# Patient Record
Sex: Male | Born: 1945 | Race: White | Hispanic: No | Smoking: Never smoker
Health system: Southern US, Community
[De-identification: ages and names within clinical notes are randomized; demographics above are authoritative.]

## PROBLEM LIST (undated history)

## (undated) DIAGNOSIS — N289 Disorder of kidney and ureter, unspecified: Secondary | ICD-10-CM

## (undated) DIAGNOSIS — C801 Malignant (primary) neoplasm, unspecified: Secondary | ICD-10-CM

## (undated) DIAGNOSIS — I10 Essential (primary) hypertension: Secondary | ICD-10-CM

## (undated) DIAGNOSIS — N35919 Unspecified urethral stricture, male, unspecified site: Secondary | ICD-10-CM

## (undated) DIAGNOSIS — J189 Pneumonia, unspecified organism: Secondary | ICD-10-CM

## (undated) DIAGNOSIS — Z87442 Personal history of urinary calculi: Secondary | ICD-10-CM

## (undated) HISTORY — PX: TONSILLECTOMY: SUR1361

## (undated) HISTORY — PX: COLON SURGERY: SHX602

## (undated) HISTORY — PX: CHOLECYSTECTOMY: SHX55

## (undated) HISTORY — PX: ANAL FISSURE REPAIR: SHX2312

---
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
## 2009-01-15 ENCOUNTER — Encounter
Admission: RE | Admit: 2009-01-15 | Discharge: 2009-01-15 | Payer: Self-pay | Source: Home / Self Care | Admitting: Neurology

## 2015-07-15 ENCOUNTER — Telehealth: Payer: Self-pay | Admitting: Internal Medicine

## 2015-07-15 NOTE — Telephone Encounter (Signed)
ERROR

## 2017-03-27 DIAGNOSIS — J189 Pneumonia, unspecified organism: Secondary | ICD-10-CM

## 2017-03-27 HISTORY — DX: Pneumonia, unspecified organism: J18.9

## 2018-09-19 ENCOUNTER — Encounter (HOSPITAL_COMMUNITY): Payer: Self-pay | Admitting: Emergency Medicine

## 2018-09-19 ENCOUNTER — Emergency Department (HOSPITAL_COMMUNITY)
Admission: EM | Admit: 2018-09-19 | Discharge: 2018-09-19 | Disposition: A | Discharge status: Home / Self Care | Payer: Medicare Other | Attending: Emergency Medicine | Admitting: Emergency Medicine

## 2018-09-19 ENCOUNTER — Other Ambulatory Visit: Payer: Self-pay

## 2018-09-19 ENCOUNTER — Emergency Department (HOSPITAL_COMMUNITY): Payer: Medicare Other

## 2018-09-19 DIAGNOSIS — K7689 Other specified diseases of liver: Secondary | ICD-10-CM | POA: Diagnosis not present

## 2018-09-19 DIAGNOSIS — R109 Unspecified abdominal pain: Secondary | ICD-10-CM

## 2018-09-19 DIAGNOSIS — N201 Calculus of ureter: Secondary | ICD-10-CM | POA: Insufficient documentation

## 2018-09-19 DIAGNOSIS — N390 Urinary tract infection, site not specified: Secondary | ICD-10-CM

## 2018-09-19 DIAGNOSIS — I10 Essential (primary) hypertension: Secondary | ICD-10-CM | POA: Insufficient documentation

## 2018-09-19 DIAGNOSIS — D075 Carcinoma in situ of prostate: Secondary | ICD-10-CM | POA: Insufficient documentation

## 2018-09-19 DIAGNOSIS — N35919 Unspecified urethral stricture, male, unspecified site: Secondary | ICD-10-CM | POA: Insufficient documentation

## 2018-09-19 DIAGNOSIS — N200 Calculus of kidney: Secondary | ICD-10-CM | POA: Diagnosis not present

## 2018-09-19 DIAGNOSIS — N179 Acute kidney failure, unspecified: Secondary | ICD-10-CM

## 2018-09-19 HISTORY — DX: Unspecified urethral stricture, male, unspecified site: N35.919

## 2018-09-19 HISTORY — DX: Malignant (primary) neoplasm, unspecified: C80.1

## 2018-09-19 HISTORY — DX: Essential (primary) hypertension: I10

## 2018-09-19 HISTORY — DX: Disorder of kidney and ureter, unspecified: N28.9

## 2018-09-19 LAB — URINALYSIS, ROUTINE W REFLEX MICROSCOPIC
Bilirubin Urine: NEGATIVE
Glucose, UA: NEGATIVE mg/dL
Ketones, ur: NEGATIVE mg/dL
Nitrite: POSITIVE — AB
Protein, ur: 30 mg/dL — AB
RBC / HPF: >50 RBC/hpf — ABNORMAL HIGH (ref 0–5)
Specific Gravity, Urine: 1.019 (ref 1.005–1.030)
WBC, UA: >50 WBC/hpf — ABNORMAL HIGH (ref 0–5)
pH: 6 (ref 5.0–8.0)

## 2018-09-19 LAB — CBC WITH DIFFERENTIAL/PLATELET
Abs Immature Granulocytes: 0.03 10*3/uL (ref 0.00–0.07)
Basophils Absolute: 0.1 10*3/uL (ref 0.0–0.1)
Basophils Relative: 1 %
Eosinophils Absolute: 0.1 10*3/uL (ref 0.0–0.5)
Eosinophils Relative: 1 %
HCT: 43.5 % (ref 39.0–52.0)
Hemoglobin: 14 g/dL (ref 13.0–17.0)
Immature Granulocytes: 0 %
Lymphocytes Relative: 10 %
Lymphs Abs: 0.9 10*3/uL (ref 0.7–4.0)
MCH: 30.7 pg (ref 26.0–34.0)
MCHC: 32.2 g/dL (ref 30.0–36.0)
MCV: 95.4 fL (ref 80.0–100.0)
Monocytes Absolute: 0.9 10*3/uL (ref 0.1–1.0)
Monocytes Relative: 9 %
Neutro Abs: 7.8 10*3/uL — ABNORMAL HIGH (ref 1.7–7.7)
Neutrophils Relative %: 79 %
Platelets: 214 10*3/uL (ref 150–400)
RBC: 4.56 MIL/uL (ref 4.22–5.81)
RDW: 14 % (ref 11.5–15.5)
WBC: 9.8 10*3/uL (ref 4.0–10.5)
nRBC: 0 % (ref 0.0–0.2)

## 2018-09-19 LAB — BASIC METABOLIC PANEL
Anion gap: 9 (ref 5–15)
BUN: 22 mg/dL (ref 8–23)
CO2: 24 mmol/L (ref 22–32)
Calcium: 8.8 mg/dL — ABNORMAL LOW (ref 8.9–10.3)
Chloride: 106 mmol/L (ref 98–111)
Creatinine, Ser: 1.29 mg/dL — ABNORMAL HIGH (ref 0.61–1.24)
GFR calc Af Amer: >60 mL/min (ref >60)
GFR calc non Af Amer: 54 mL/min — ABNORMAL LOW (ref >60)
Glucose, Bld: 90 mg/dL (ref 70–99)
Potassium: 4 mmol/L (ref 3.5–5.1)
Sodium: 139 mmol/L (ref 135–145)

## 2018-09-19 MED ORDER — KETOROLAC TROMETHAMINE 30 MG/ML IJ SOLN
30.0000 mg | Freq: Once | INTRAMUSCULAR | Status: AC
  Administered 2018-09-19: 30 mg via INTRAVENOUS
  Filled 2018-09-19: qty 1

## 2018-09-19 MED ORDER — ONDANSETRON HCL 8 MG PO TABS: 8.0000 mg | ORAL_TABLET | ORAL | 0 refills | Status: DC | PRN

## 2018-09-19 MED ORDER — FENTANYL CITRATE (PF) 100 MCG/2ML IJ SOLN
50.0000 ug | Freq: Once | INTRAMUSCULAR | Status: AC
  Administered 2018-09-19: 50 ug via INTRAVENOUS
  Filled 2018-09-19: qty 2

## 2018-09-19 MED ORDER — CEFTRIAXONE SODIUM 1 G IJ SOLR
1.0000 g | Freq: Once | INTRAMUSCULAR | Status: AC
  Administered 2018-09-19: 1 g via INTRAVENOUS
  Filled 2018-09-19: qty 10

## 2018-09-19 MED ORDER — OXYCODONE-ACETAMINOPHEN 5-325 MG PO TABS: 1.0000 | ORAL_TABLET | ORAL | 0 refills | Status: DC | PRN

## 2018-09-19 MED ORDER — SULFAMETHOXAZOLE-TRIMETHOPRIM 800-160 MG PO TABS: 1.0000 | ORAL_TABLET | Freq: Two times a day (BID) | ORAL | 0 refills | Status: AC

## 2018-09-19 MED ORDER — SODIUM CHLORIDE 0.9 % IV BOLUS
500.0000 mL | Freq: Once | INTRAVENOUS | Status: AC
  Administered 2018-09-19: 500 mL via INTRAVENOUS

## 2018-09-19 MED ORDER — ONDANSETRON HCL 4 MG/2ML IJ SOLN
4.0000 mg | Freq: Once | INTRAMUSCULAR | Status: AC
  Administered 2018-09-19: 19:00:00 4 mg via INTRAVENOUS
  Filled 2018-09-19: qty 2

## 2018-09-19 NOTE — ED Provider Notes (Addendum)
[facility] EMERGENCY DEPARTMENT Provider Note   CSN: [id number] Arrival date & time: 09/19/18  1708    History   Chief Complaint Chief Complaint  Patient presents with   Flank Pain    HPI Donald Gibson is a 73 y.o. male.     Right flank pain for several hours.  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  Patient self catheterizes daily.  No fever, chills, hematuria, dysuria.  Severity is moderate.  Nothing makes symptoms better or worse.  Recent PSA 11.     Past Medical History:  Diagnosis Date   Cancer [facility])    prostate   Hypertension    Renal disorder    Urethral stricture in male     There are no active problems to display for this patient.   Past Surgical History:  Procedure Laterality Date   ANAL FISSURE REPAIR     CHOLECYSTECTOMY     COLON SURGERY     TONSILLECTOMY          Home Medications    Prior to Admission medications   Not on File    Family History Family History  Problem Relation Age of Onset   Alzheimer's disease Mother    Hypertension Father    Kidney failure Father     Social History Social History   Tobacco Use   Smoking status: Never Smoker   Smokeless tobacco: Never Used  Substance Use Topics   Alcohol use: Never    Frequency: Never   Drug use: Never     Allergies   Codeine and Penicillins   Review of Systems Review of Systems  All other systems reviewed and are negative.    Physical Exam Updated Vital Signs BP 135/74 (BP Location: Right Arm)    Pulse 65    Temp 98.8 F (37.1 C) (Oral)    Resp 17    SpO2 97%   Physical Exam Vitals signs and nursing note reviewed.  Constitutional:      Appearance: He is well-developed.     Comments: nad  HENT:     Head: Normocephalic and atraumatic.  Eyes:     Conjunctiva/sclera: Conjunctivae normal.  Neck:     Musculoskeletal: Neck supple.  Cardiovascular:     Rate and  Rhythm: Normal rate and regular rhythm.  Pulmonary:     Effort: Pulmonary effort is normal.     Breath sounds: Normal breath sounds.  Abdominal:     General: Bowel sounds are normal.     Palpations: Abdomen is soft.  Genitourinary:    Comments: Tender right flank Musculoskeletal: Normal range of motion.  Skin:    General: Skin is warm and dry.  Neurological:     Mental Status: He is alert and oriented to person, place, and time.  Psychiatric:        Behavior: Behavior normal.      ED Treatments / Results  Labs (all labs ordered are listed, but only abnormal results are displayed) Labs Reviewed  URINALYSIS, ROUTINE W REFLEX MICROSCOPIC - Abnormal; Notable for the following components:      Result Value   Color, Urine AMBER (*)    APPearance HAZY (*)    Hgb urine dipstick LARGE (*)    Protein, ur 30 (*)    Nitrite POSITIVE (*)    Leukocytes,Ua LARGE (*)    RBC / HPF >50 (*)    WBC, UA >50 (*)  Bacteria, UA MANY (*)    All other components within normal limits  CBC WITH DIFFERENTIAL/PLATELET - Abnormal; Notable for the following components:   Neutro Abs 7.8 (*)    All other components within normal limits  BASIC METABOLIC PANEL - Abnormal; Notable for the following components:   Creatinine, Ser 1.29 (*)    Calcium 8.8 (*)    GFR calc non Af Amer 54 (*)    All other components within normal limits  URINE CULTURE    EKG None  Radiology Ct Renal Stone Study  Result Date: 09/19/2018 CLINICAL DATA:  Right-sided flank pain, history of prostate and colon cancer EXAM: CT ABDOMEN AND PELVIS WITHOUT CONTRAST TECHNIQUE: Multidetector CT imaging of the abdomen and pelvis was performed following the standard protocol without IV contrast. COMPARISON:  CT 06/30/2011 FINDINGS: Lower chest: Lung bases demonstrate mild dependent atelectasis. No consolidation or effusion. Borderline cardiomegaly. Hepatobiliary: Development of vague hypodense liver masses, measuring up to 19 mm in  size. Status post cholecystectomy. No biliary dilatation Pancreas: Unremarkable. No pancreatic ductal dilatation or surrounding inflammatory changes. Spleen: Normal in size without focal abnormality. Adrenals/Urinary Tract: Adrenal glands are normal. Perinephric fat stranding. 7 mm stone lower pole right kidney. Mild right hydronephrosis, secondary to a 7 mm stone at the right UPJ. Thick-walled appearance of the urinary bladder with surrounding stranding. Stomach/Bowel: Stomach nonenlarged. No dilated small bowel. Large stool burden. Postsurgical changes of the sigmoid colon. Vascular/Lymphatic: Moderate aortic atherosclerosis. No aneurysmal dilatation. No significantly enlarged lymph nodes. Reproductive: Slightly enlarged prostate abutting or invading posterior wall of the bladder. Other: Negative for free air or free fluid. Fat within the bilateral inguinal canals. Musculoskeletal: No acute or suspicious osseous abnormality. Degenerative changes of the spine. IMPRESSION: 1. Mild right hydronephrosis, secondary to a 7 mm stone at the right UPJ. 2. Additional intrarenal stone on the right 3. Interim development of vague hypodense liver masses concerning for metastatic disease given clinical history. Further evaluation with nonemergent MRI could be obtained. 4. Slightly enlarged prostate which either abuts or invades the posterior wall of the bladder. Bladder appears thick walled with surrounding hazy soft tissue stranding suggesting cystitis. Electronically Signed   By: Donavan Foil M.D.   On: 09/19/2018 21:51    Procedures Procedures (including critical care time)  Medications Ordered in ED Medications  sodium chloride 0.9 % bolus 500 mL (0 mLs Intravenous Stopped 09/19/18 2011)  fentaNYL (SUBLIMAZE) injection 50 mcg (50 mcg Intravenous Given 09/19/18 1855)  ketorolac (TORADOL) 30 MG/ML injection 30 mg (30 mg Intravenous Given 09/19/18 1855)  ondansetron (ZOFRAN) injection 4 mg (4 mg Intravenous Given  09/19/18 1855)  fentaNYL (SUBLIMAZE) injection 50 mcg (50 mcg Intravenous Given 09/19/18 2136)  cefTRIAXone (ROCEPHIN) 1 g in sodium chloride 0.9 % 100 mL IVPB (1 g Intravenous New Bag/Given 09/19/18 2138)     Initial Impression / Assessment and Plan / ED Course  I have reviewed the triage vital signs and the nursing notes.  Pertinent labs & imaging results that were available during my care of the patient were reviewed by me and considered in my medical decision making (see chart for details).        History and physical most consistent with kidney stone.  Labs, UA, CT renal, pain management.   2230: CT reviewed.  IV Rocephin for alleged urinary infection.  Will discuss with urology.  CRITICAL CARE Performed by: Nat Christen Total critical care time: 30 minutes Critical care time was exclusive of separately billable procedures and  treating other patients. Critical care was necessary to treat or prevent imminent or life-threatening deterioration. Critical care was time spent personally by me on the following activities: development of treatment plan with patient and/or surrogate as well as nursing, discussions with consultants, evaluation of patient's response to treatment, examination of patient, obtaining history from patient or surrogate, ordering and performing treatments and interventions, ordering and review of laboratory studies, ordering and review of radiographic studies, pulse oximetry and re-evaluation of patient's condition. Final Clinical Impressions(s) / ED Diagnoses   Final diagnoses:  Right flank pain  Right kidney stone  Urinary tract infection without hematuria, site unspecified  AKI (acute kidney injury) [facility])    ED Discharge Orders    None       Nat Christen, MD 09/19/18 Merrily Pew    Nat Christen, MD 09/19/18 2231    Nat Christen, MD 09/19/18 2231

## 2018-09-19 NOTE — Discharge Instructions (Addendum)
You have a 7 mm kidney stone.  Follow-up with [facility] in [place].  Call in the morning for an appointment.  Additionally, you have evidence of a urinary tract infection and the possibility of your cancer spreading into the liver.  Prescription for pain medicine, nausea medicine, antibiotic.  Follow-up at [facility] for the prostate cancer issue.  Copies of tests given.  Also, 6 pain pills given.

## 2018-09-19 NOTE — ED Triage Notes (Signed)
Patient reports R flank pain that started today. Has prior history of kidney stones.

## 2018-09-20 ENCOUNTER — Other Ambulatory Visit: Payer: Self-pay | Admitting: Urology

## 2018-09-20 MED FILL — Oxycodone w/ Acetaminophen Tab 5-325 MG: ORAL | Qty: 6 | Status: AC

## 2018-09-22 LAB — URINE CULTURE: Culture: >=100000 — AB

## 2018-09-23 ENCOUNTER — Telehealth: Payer: Self-pay | Admitting: Emergency Medicine

## 2018-09-23 NOTE — Addendum Note (Signed)
Addended by: Link Snuffer D III on: 09/23/2018 03:06 PM   Modules accepted: Orders

## 2018-09-23 NOTE — Telephone Encounter (Signed)
Post ED Visit - Positive Culture Follow-up  Culture report reviewed by antimicrobial stewardship pharmacist: [place] Team []  Elenor Quinones, Pharm.D. []  Heide Guile, Pharm.D., BCPS AQ-ID []  Parks Neptune, Pharm.D., BCPS []  Alycia Rossetti, Pharm.D., BCPS []  [place], Pharm.D., BCPS, AAHIVP []  Legrand Como, Pharm.D., BCPS, AAHIVP []  Salome Arnt, PharmD, BCPS []  Johnnette Gourd, PharmD, BCPS []  Hughes Better, PharmD, BCPS []  Leeroy Cha, PharmD []  Laqueta Linden, PharmD, BCPS []  Albertina Parr, PharmD Elicia Lamp PharmD  [place] Team []  Leodis Sias, PharmD []  Lindell Spar, PharmD []  Royetta Asal, PharmD []  Graylin Shiver, Rph []  Rema Fendt) Glennon Mac, PharmD []  Arlyn Dunning, PharmD []  Netta Cedars, PharmD []  Dia Sitter, PharmD []  Leone Haven, PharmD []  Gretta Arab, PharmD []  Theodis Shove, PharmD []  Peggyann Juba, PharmD []  Reuel Boom, PharmD   Positive urine culture Treated with sulfamethoxazole-trimethoprim, organism sensitive to the same and no further patient follow-up is required at this time.  Hazle Nordmann 09/23/2018, 10:23 AM

## 2018-09-25 NOTE — Patient Instructions (Addendum)
YOU NEED TO HAVE A COVID 19 TEST ON_7/2______ @_______ , THIS TEST MUST BE DONE BEFORE SURGERY, COME TO [facility] ENTRANCE. ONCE YOUR COVID TEST IS COMPLETED, PLEASE BEGIN THE QUARANTINE INSTRUCTIONS AS OUTLINED IN YOUR HANDOUT.                Donald Gibson     Your procedure is scheduled on: 09-30-2018   Report to [facility] Main  Entrance  Report to admitting at 1:20 PM      Call this number if you have problems the morning of surgery [phone]    Remember: Do not eat food  :After [place] MIDNIGHT UNTIL 9:20 AM.  NOTHING BY MOUTH AFTER 9:20 AM.    BRUSH YOUR TEETH MORNING OF SURGERY AND RINSE YOUR MOUTH OUT, NO CHEWING GUM CANDY OR MINTS.     CLEAR LIQUID DIET   Foods Allowed                                                                     Foods Excluded  Coffee and tea, regular and decaf                             liquids that you cannot  Plain Jell-O in any flavor                                             see through such as: Fruit ices (not with fruit pulp)                                     milk, soups, orange juice  Iced Popsicles                                    All solid food Carbonated beverages, regular and diet                                    Cranberry, grape and apple juices Sports drinks like Gatorade Lightly seasoned clear broth or consume(fat free) Sugar, honey syrup  Sample Menu Breakfast                                Lunch                                     Supper Cranberry juice                    Beef broth                            Chicken broth Jell-O  Grape juice                           Apple juice Coffee or tea                        Jell-O                                      Popsicle                                                Coffee or tea                        Coffee or  tea  _____________________________________________________________________     Take these medicines the morning of surgery with A SIP OF WATER: Desyrel, Effexor-XL, Lopressor, Amlodipine, Prilosec                                 You may not have any metal on your body including piercings             Do not wear jewelry,  lotions, powders or , deodorant                           Men may shave face and neck.   Do not bring valuables to the hospital. [place].  Contacts, dentures or bridgework may not be worn into surgery.  Leave suitcase in the car. After surgery it may be brought to your room.     Patients discharged the day of surgery will not be allowed to drive home. IF YOU ARE HAVING SURGERY AND GOING HOME THE SAME DAY, YOU MUST HAVE AN ADULT TO DRIVE YOU HOME AND BE WITH YOU FOR 24 HOURS. YOU MAY GO HOME BY TAXI OR UBER OR ORTHERWISE, BUT AN ADULT MUST ACCOMPANY YOU HOME AND STAY WITH YOU FOR 24 HOURS.  Name and phone number of your driver:  Special Instructions: N/A              Please read over the following fact sheets you were given: _____________________________________________________________________             [facility] - Preparing for Surgery Before surgery, you can play an important role.  Because skin is not sterile, your skin needs to be as free of germs as possible.  You can reduce the number of germs on your skin by washing with CHG (chlorahexidine gluconate) soap before surgery.  CHG is an antiseptic cleaner which kills germs and bonds with the skin to continue killing germs even after washing. Please DO NOT use if you have an allergy to CHG or antibacterial soaps.  If your skin becomes reddened/irritated stop using the CHG and inform your nurse when you arrive at Short Stay. Do not shave (including legs and underarms) for at least 48 hours prior to the first CHG shower.  You may shave your face/neck. Please  follow these instructions carefully:  1.  Shower with CHG Soap the night before surgery and the  morning of Surgery.  2.  If you choose to wash your hair, wash your hair first as usual with your  normal  shampoo.  3.  After you shampoo, rinse your hair and body thoroughly to remove the  shampoo.                           4.  Use CHG as you would any other liquid soap.  You can apply chg directly  to the skin and wash                       Gently with a scrungie or clean washcloth.  5.  Apply the CHG Soap to your body ONLY FROM THE NECK DOWN.   Do not use on face/ open                           Wound or open sores. Avoid contact with eyes, ears mouth and genitals (private parts).                       Wash face,  Genitals (private parts) with your normal soap.             6.  Wash thoroughly, paying special attention to the area where your surgery  will be performed.  7.  Thoroughly rinse your body with warm water from the neck down.  8.  DO NOT shower/wash with your normal soap after using and rinsing off  the CHG Soap.                9.  Pat yourself dry with a clean towel.            10.  Wear clean pajamas.            11.  Place clean sheets on your bed the night of your first shower and do not  sleep with pets. Day of Surgery : Do not apply any lotions/deodorants the morning of surgery.  Please wear clean clothes to the hospital/surgery center.  FAILURE TO FOLLOW THESE INSTRUCTIONS MAY RESULT IN THE CANCELLATION OF YOUR SURGERY PATIENT SIGNATURE_________________________________  NURSE SIGNATURE__________________________________  ________________________________________________________________________

## 2018-09-26 ENCOUNTER — Encounter (HOSPITAL_COMMUNITY)
Admission: RE | Admit: 2018-09-26 | Discharge: 2018-09-26 | Disposition: A | Discharge status: Home / Self Care | Payer: Medicare Other | Source: Ambulatory Visit | Attending: Urology | Admitting: Urology

## 2018-09-26 ENCOUNTER — Other Ambulatory Visit: Payer: Self-pay

## 2018-09-26 ENCOUNTER — Other Ambulatory Visit (HOSPITAL_COMMUNITY)
Admission: RE | Admit: 2018-09-26 | Discharge: 2018-09-26 | Disposition: A | Discharge status: Home / Self Care | Payer: Medicare Other | Source: Ambulatory Visit | Attending: Urology | Admitting: Urology

## 2018-09-26 ENCOUNTER — Encounter (HOSPITAL_COMMUNITY): Payer: Self-pay

## 2018-09-26 DIAGNOSIS — I10 Essential (primary) hypertension: Secondary | ICD-10-CM | POA: Diagnosis not present

## 2018-09-26 DIAGNOSIS — Z1159 Encounter for screening for other viral diseases: Secondary | ICD-10-CM | POA: Insufficient documentation

## 2018-09-26 DIAGNOSIS — N201 Calculus of ureter: Secondary | ICD-10-CM | POA: Insufficient documentation

## 2018-09-26 DIAGNOSIS — Z01818 Encounter for other preprocedural examination: Secondary | ICD-10-CM | POA: Diagnosis not present

## 2018-09-26 HISTORY — DX: Personal history of urinary calculi: Z87.442

## 2018-09-26 LAB — CBC
HCT: 39.9 % (ref 39.0–52.0)
Hemoglobin: 12.6 g/dL — ABNORMAL LOW (ref 13.0–17.0)
MCH: 30 pg (ref 26.0–34.0)
MCHC: 31.6 g/dL (ref 30.0–36.0)
MCV: 95 fL (ref 80.0–100.0)
Platelets: 225 10*3/uL (ref 150–400)
RBC: 4.2 MIL/uL — ABNORMAL LOW (ref 4.22–5.81)
RDW: 13.6 % (ref 11.5–15.5)
WBC: 8.2 10*3/uL (ref 4.0–10.5)
nRBC: 0 % (ref 0.0–0.2)

## 2018-09-26 LAB — BASIC METABOLIC PANEL
Anion gap: 7 (ref 5–15)
BUN: 22 mg/dL (ref 8–23)
CO2: 24 mmol/L (ref 22–32)
Calcium: 9 mg/dL (ref 8.9–10.3)
Chloride: 104 mmol/L (ref 98–111)
Creatinine, Ser: 1.9 mg/dL — ABNORMAL HIGH (ref 0.61–1.24)
GFR calc Af Amer: 39 mL/min — ABNORMAL LOW (ref >60)
GFR calc non Af Amer: 34 mL/min — ABNORMAL LOW (ref >60)
Glucose, Bld: 88 mg/dL (ref 70–99)
Potassium: 4.8 mmol/L (ref 3.5–5.1)
Sodium: 135 mmol/L (ref 135–145)

## 2018-09-26 LAB — SARS CORONAVIRUS 2 (TAT 6-24 HRS): SARS Coronavirus 2: NEGATIVE

## 2018-09-30 ENCOUNTER — Ambulatory Visit (HOSPITAL_COMMUNITY): Payer: Medicare Other | Admitting: Physician Assistant

## 2018-09-30 ENCOUNTER — Encounter (HOSPITAL_COMMUNITY): Payer: Self-pay | Admitting: Emergency Medicine

## 2018-09-30 ENCOUNTER — Encounter (HOSPITAL_COMMUNITY)
Admission: RE | Disposition: A | Discharge status: Home / Self Care | Payer: Self-pay | Source: Home / Self Care | Attending: Urology

## 2018-09-30 ENCOUNTER — Other Ambulatory Visit: Payer: Self-pay

## 2018-09-30 ENCOUNTER — Ambulatory Visit (HOSPITAL_COMMUNITY)
Admission: RE | Admit: 2018-09-30 | Discharge: 2018-09-30 | Disposition: A | Discharge status: Home / Self Care | Payer: Medicare Other | Attending: Urology | Admitting: Urology

## 2018-09-30 ENCOUNTER — Ambulatory Visit (HOSPITAL_COMMUNITY): Payer: Medicare Other

## 2018-09-30 ENCOUNTER — Ambulatory Visit (HOSPITAL_COMMUNITY): Payer: Medicare Other | Admitting: Anesthesiology

## 2018-09-30 DIAGNOSIS — K219 Gastro-esophageal reflux disease without esophagitis: Secondary | ICD-10-CM | POA: Insufficient documentation

## 2018-09-30 DIAGNOSIS — N35919 Unspecified urethral stricture, male, unspecified site: Secondary | ICD-10-CM | POA: Diagnosis not present

## 2018-09-30 DIAGNOSIS — Z8546 Personal history of malignant neoplasm of prostate: Secondary | ICD-10-CM | POA: Insufficient documentation

## 2018-09-30 DIAGNOSIS — J45909 Unspecified asthma, uncomplicated: Secondary | ICD-10-CM | POA: Insufficient documentation

## 2018-09-30 DIAGNOSIS — E7800 Pure hypercholesterolemia, unspecified: Secondary | ICD-10-CM | POA: Diagnosis not present

## 2018-09-30 DIAGNOSIS — F419 Anxiety disorder, unspecified: Secondary | ICD-10-CM | POA: Diagnosis not present

## 2018-09-30 DIAGNOSIS — I10 Essential (primary) hypertension: Secondary | ICD-10-CM | POA: Diagnosis not present

## 2018-09-30 DIAGNOSIS — Z79899 Other long term (current) drug therapy: Secondary | ICD-10-CM | POA: Insufficient documentation

## 2018-09-30 DIAGNOSIS — N201 Calculus of ureter: Secondary | ICD-10-CM | POA: Insufficient documentation

## 2018-09-30 HISTORY — PX: CYSTOSCOPY WITH URETHRAL DILATATION: SHX5125

## 2018-09-30 HISTORY — PX: CYSTOSCOPY WITH URETEROSCOPY AND STENT PLACEMENT: SHX6377

## 2018-09-30 SURGERY — CYSTOURETEROSCOPY, WITH STENT INSERTION
Anesthesia: General | Site: Urethra | Laterality: Right

## 2018-09-30 MED ORDER — DEXAMETHASONE SODIUM PHOSPHATE 10 MG/ML IJ SOLN
INTRAMUSCULAR | Status: DC | PRN
  Administered 2018-09-30: 5 mg via INTRAVENOUS

## 2018-09-30 MED ORDER — OXYCODONE HCL 5 MG/5ML PO SOLN: 5.0000 mg | Freq: Once | ORAL | Status: DC | PRN

## 2018-09-30 MED ORDER — FENTANYL CITRATE (PF) 100 MCG/2ML IJ SOLN
INTRAMUSCULAR | Status: DC | PRN
  Administered 2018-09-30 (×5): 25 ug via INTRAVENOUS
  Administered 2018-09-30: 50 ug via INTRAVENOUS
  Administered 2018-09-30: 25 ug via INTRAVENOUS

## 2018-09-30 MED ORDER — HYDROMORPHONE HCL 1 MG/ML IJ SOLN
0.2500 mg | INTRAMUSCULAR | Status: DC | PRN
  Administered 2018-09-30: 0.5 mg via INTRAVENOUS

## 2018-09-30 MED ORDER — FENTANYL CITRATE (PF) 100 MCG/2ML IJ SOLN
INTRAMUSCULAR | Status: AC
  Filled 2018-09-30: qty 2

## 2018-09-30 MED ORDER — PROPOFOL 10 MG/ML IV BOLUS
INTRAVENOUS | Status: AC
  Filled 2018-09-30: qty 20

## 2018-09-30 MED ORDER — PROPOFOL 10 MG/ML IV BOLUS
INTRAVENOUS | Status: DC | PRN
  Administered 2018-09-30: 140 mg via INTRAVENOUS
  Administered 2018-09-30: 20 mg via INTRAVENOUS
  Administered 2018-09-30: 30 mg via INTRAVENOUS
  Administered 2018-09-30: 20 mg via INTRAVENOUS

## 2018-09-30 MED ORDER — IOHEXOL 300 MG/ML  SOLN
INTRAMUSCULAR | Status: DC | PRN
  Administered 2018-09-30: 50 mL

## 2018-09-30 MED ORDER — ONDANSETRON HCL 4 MG/2ML IJ SOLN
INTRAMUSCULAR | Status: DC | PRN
  Administered 2018-09-30: 4 mg via INTRAVENOUS

## 2018-09-30 MED ORDER — DEXAMETHASONE SODIUM PHOSPHATE 10 MG/ML IJ SOLN
INTRAMUSCULAR | Status: AC
  Filled 2018-09-30: qty 1

## 2018-09-30 MED ORDER — ONDANSETRON HCL 4 MG/2ML IJ SOLN
INTRAMUSCULAR | Status: AC
  Filled 2018-09-30: qty 2

## 2018-09-30 MED ORDER — METHYLENE BLUE 0.5 % INJ SOLN
INTRAVENOUS | Status: AC
  Filled 2018-09-30: qty 10

## 2018-09-30 MED ORDER — CIPROFLOXACIN IN D5W 400 MG/200ML IV SOLN
400.0000 mg | INTRAVENOUS | Status: AC
  Administered 2018-09-30: 400 mg via INTRAVENOUS
  Filled 2018-09-30: qty 200

## 2018-09-30 MED ORDER — ONDANSETRON HCL 4 MG/2ML IJ SOLN
4.0000 mg | Freq: Once | INTRAMUSCULAR | Status: AC | PRN
  Administered 2018-09-30: 18:00:00 4 mg via INTRAVENOUS

## 2018-09-30 MED ORDER — LIDOCAINE 2% (20 MG/ML) 5 ML SYRINGE
INTRAMUSCULAR | Status: DC | PRN
  Administered 2018-09-30: 80 mg via INTRAVENOUS

## 2018-09-30 MED ORDER — OXYCODONE HCL 5 MG PO TABS: 5.0000 mg | ORAL_TABLET | Freq: Once | ORAL | Status: DC | PRN

## 2018-09-30 MED ORDER — SODIUM CHLORIDE 0.9 % IR SOLN
Status: DC | PRN
  Administered 2018-09-30: 9000 mL

## 2018-09-30 MED ORDER — FENTANYL CITRATE (PF) 100 MCG/2ML IJ SOLN
25.0000 ug | INTRAMUSCULAR | Status: DC | PRN
  Administered 2018-09-30: 25 ug via INTRAVENOUS
  Administered 2018-09-30 (×2): 50 ug via INTRAVENOUS

## 2018-09-30 MED ORDER — METHYLENE BLUE 0.5 % INJ SOLN
INTRAVENOUS | Status: DC | PRN
  Administered 2018-09-30: 5 mg via INTRAVENOUS

## 2018-09-30 MED ORDER — SODIUM CHLORIDE 0.9 % IV SOLN: INTRAVENOUS | Status: DC

## 2018-09-30 MED ORDER — LACTATED RINGERS IV SOLN
INTRAVENOUS | Status: DC
  Administered 2018-09-30 (×2): via INTRAVENOUS

## 2018-09-30 MED ORDER — PHENYLEPHRINE 40 MCG/ML (10ML) SYRINGE FOR IV PUSH (FOR BLOOD PRESSURE SUPPORT)
PREFILLED_SYRINGE | INTRAVENOUS | Status: AC
  Filled 2018-09-30: qty 10

## 2018-09-30 MED ORDER — HYDROMORPHONE HCL 1 MG/ML IJ SOLN
INTRAMUSCULAR | Status: AC
  Filled 2018-09-30: qty 1

## 2018-09-30 MED ORDER — ACETAMINOPHEN 10 MG/ML IV SOLN
1000.0000 mg | Freq: Once | INTRAVENOUS | Status: AC
  Administered 2018-09-30: 18:00:00 1000 mg via INTRAVENOUS

## 2018-09-30 MED ORDER — ACETAMINOPHEN 10 MG/ML IV SOLN
INTRAVENOUS | Status: AC
  Filled 2018-09-30: qty 100

## 2018-09-30 SURGICAL SUPPLY — 29 items
BAG URINE DRAINAGE (UROLOGICAL SUPPLIES) ×8 IMPLANT
BAG URO CATCHER STRL LF (MISCELLANEOUS) ×4 IMPLANT
BALLN NEPHROSTOMY (BALLOONS)
BALLOON NEPHROSTOMY (BALLOONS) IMPLANT
BASKET LASER NITINOL 1.9FR (BASKET) IMPLANT
BASKET ZERO TIP NITINOL 2.4FR (BASKET) IMPLANT
CATH FOLEY 2W COUNCIL 20FR 5CC (CATHETERS) ×4 IMPLANT
CATH INTERMIT  6FR 70CM (CATHETERS) IMPLANT
CATH ROBINSON RED A/P 14FR (CATHETERS) ×4 IMPLANT
CATH URET 5FR 28IN CONE TIP (BALLOONS)
CATH URET 5FR 70CM CONE TIP (BALLOONS) IMPLANT
CATH URET DUAL LUMEN 6-10FR 50 (CATHETERS) ×4 IMPLANT
CLOTH BEACON ORANGE TIMEOUT ST (SAFETY) ×4 IMPLANT
COVER WAND RF STERILE (DRAPES) IMPLANT
EXTRACTOR STONE 1.7FRX115CM (UROLOGICAL SUPPLIES) IMPLANT
FIBER LASER FLEXIVA 365 (UROLOGICAL SUPPLIES) IMPLANT
FIBER LASER TRAC TIP (UROLOGICAL SUPPLIES) ×8 IMPLANT
GLOVE BIO SURGEON STRL SZ7.5 (GLOVE) ×4 IMPLANT
GOWN STRL REUS W/TWL XL LVL3 (GOWN DISPOSABLE) ×4 IMPLANT
GUIDEWIRE ANG ZIPWIRE 038X150 (WIRE) IMPLANT
GUIDEWIRE STR DUAL SENSOR (WIRE) ×8 IMPLANT
KIT TURNOVER KIT A (KITS) IMPLANT
MANIFOLD NEPTUNE II (INSTRUMENTS) ×4 IMPLANT
NS IRRIG 1000ML POUR BTL (IV SOLUTION) IMPLANT
PACK CYSTO (CUSTOM PROCEDURE TRAY) ×4 IMPLANT
SHEATH URETERAL 12FRX28CM (UROLOGICAL SUPPLIES) IMPLANT
SHEATH URETERAL 12FRX35CM (MISCELLANEOUS) ×4 IMPLANT
TUBING UROLOGY SET (TUBING) ×4 IMPLANT
WATER STERILE IRR 3000ML UROMA (IV SOLUTION) ×4 IMPLANT

## 2018-09-30 NOTE — Anesthesia Procedure Notes (Addendum)
Procedure Name: LMA Insertion Date/Time: 09/30/2018 3:18 PM Performed by: West Pugh, CRNA Pre-anesthesia Checklist: Patient identified, Emergency Drugs available, Suction available, Patient being monitored and Timeout performed Patient Re-evaluated:Patient Re-evaluated prior to induction Oxygen Delivery Method: Circle system utilized Preoxygenation: Pre-oxygenation with 100% oxygen Induction Type: IV induction LMA: LMA with gastric port inserted Placement Confirmation: positive ETCO2 and breath sounds checked- equal and bilateral Tube secured with: Tape Dental Injury: Teeth and Oropharynx as per pre-operative assessment

## 2018-09-30 NOTE — Op Note (Signed)
Operative Note  Preoperative diagnosis:  1.  Right ureteral calculus  Postoperative diagnosis: 1.  Right ureteral calculus 2.  Posterior urethral stricture  Procedure(s): 1.  Cystoscopy, urethral dilation, right retrograde pyelogram, right ureteroscopy with laser lithotripsy, ureteral stent placement  Surgeon: Link Snuffer, MD  Assistants: None  Anesthesia: General  Complications: None immediate  EBL: Minimal  Specimens: 1.  None  Drains/Catheters: 1.  6 x 26 double-J ureteral stent 2.  20 French council tip catheter  Intraoperative findings: 1.  Normal anterior urethra 2.  The prostatic urethra had a strictured area.  The scope was not able to enter.  I was able to pass a wire so that I could dilate the strictured area sequentially. 3.  Right retrograde pyelogram revealed a filling defect at the ureteropelvic junction.  The stone got pushed back into the kidney.  There was mild hydronephrosis. 4.  Ureteroscopy revealed an approximately 8 mm stone that had been pushed into the kidney.  This was fragmented to tiny fragments.  Indication: 73 year old male with a proximal right ureteral calculus presents for the previously mentioned operation.  He does have a history of prostate cancer and developed a urethral stricture that he dilates with a catheter.  Description of procedure:  The patient was identified and consent was obtained.  The patient was taken to the operating room and placed in the supine position.  The patient was placed under general anesthesia.  Perioperative antibiotics were administered.  The patient was placed in dorsal lithotomy.  Patient was prepped and draped in a standard sterile fashion and a timeout was performed.  A 21 French rigid cystoscope was advanced into the urethra.  I encountered the stricture that could not be passed with the scope.  A sensor wire was advanced through the strictured area and into the bladder.  The scope was withdrawn.  I then  sequentially dilated with Michel Harrow dilators from 48 [place] up to 68 [place].  I then passed the scope alongside the wire and into the bladder.  The wire was withdrawn.  There was a great deal of trabeculation and some inflammatory change of the bladder neck.  The ureteral orifice was very difficult to find on the right.  I was finally able to access it with a sensor wire which was advanced up to the kidney under fluoroscopic guidance.  I advanced a dual-lumen ureteral catheter over the wire and up the ureter under fluoroscopic guidance just into the distal ureter.  Retrograde pyelogram was performed.  A second wire was advanced through the dual-lumen ureteral catheter and the scope and ureteral catheter were withdrawn.  A semirigid ureteroscope was advanced alongside the wires up the ureter and up to close to the renal pelvis.  No stone was encountered.  I therefore withdrew the scope and secured one the wires to the drape as a safety wire.  The wire was used to advance a 12 x 14 ureteral access sheath over the wire and up the ureter under continuous fluoroscopic guidance.  There was minimal resistance.  The inner sheath and wire were withdrawn.  A flexible ureteroscope was advanced into the kidney and pyeloscopy revealed the stone in the lower pole.  This was fragmented to tiny fragments with the laser fiber.  Once there were no clinically significant stone fragments I performed a complete pyeloscopy and no other significant stone fragments were seen.  I therefore withdrew the scope along with the access sheath.  There was no significant trauma noted to the  ureter except for a mild small false passage in the proximal portion of the ureter on the right.  This was a very small spot.  There was not a significant injury.  I withdrew the scope visualizing the rest of the ureter and no other injuries were seen and no ureteral calculus was seen.  I backloaded the wire onto a rigid cystoscope which was advanced into the  bladder followed by routine placement of a 6 x 26 double-J ureteral stent in the standard fashion followed by removal of the wire.  Fluoroscopy confirmed proximal placement and direct visualization confirmed a good coil within the bladder.  I readvanced the wire into the bladder and withdrew the scope followed by placement of an 20 [place] council tip catheter over the wire and the wire was withdrawn.  I instilled sterile water into the catheter balloon and this concluded the operation.  The patient tolerated the procedure well and was stable postoperatively.  Plan: Return in 1 week for voiding trial and stent removal

## 2018-09-30 NOTE — Discharge Instructions (Addendum)

## 2018-09-30 NOTE — Transfer of Care (Signed)
Immediate Anesthesia Transfer of Care Note  Patient: Donald Gibson  Procedure(s) Performed: CYSTOSCOPY WITH RIGHT URETEROSCOPY HOLMIUM LASER AND STENT PLACEMENT (Right Ureter) URETHRAL DILATATION (N/A Urethra)  Patient Location: PACU  Anesthesia Type:General  Level of Consciousness: awake, drowsy and patient cooperative  Airway & Oxygen Therapy: Patient Spontanous Breathing and Patient connected to face mask oxygen  Post-op Assessment: Report given to RN and Post -op Vital signs reviewed and stable  Post vital signs: Reviewed and stable  Last Vitals:  Vitals Value Taken Time  BP 151/76 09/30/18 1651  Temp    Pulse 78 09/30/18 1652  Resp 19 09/30/18 1653  SpO2 100 % 09/30/18 1652  Vitals shown include unvalidated device data.  Last Pain:  Vitals:   09/30/18 1338  TempSrc:   PainSc: 0-No pain      Patients Stated Pain Goal: 4 (38/46/65 [id number])  Complications: No apparent anesthesia complications

## 2018-09-30 NOTE — H&P (Signed)
CC/HPI: Cc: Right ureteral calculus.  HPI:  09/20/2018  Patient has a history of prostate cancer. He is status post external beam radiation therapy. 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 He performs intermittent catheterization once daily for this. He urinates fine in between. He is followed by Dr. Layne Benton at [facility], oncology. Patient has had many stones in the past. He has never required intervention. However, he went to the emergency department last night with severe right-sided flank pain. He was found to have a 7 mm proximal right ureteral calculus. The stone is visible on scout imaging as well as KUB. He is a Restaurant manager, fast food. He refuses blood transfusion. He is currently on Bactrim. He denies fever, nausea, vomiting. Pain is well controlled. He has not required narcotics.     ALLERGIES: codeine Penicillin    MEDICATIONS: Bactrim Ds  Metoprolol Tartrate 50 mg tablet  Omeprazole 20 mg capsule,delayed release  Tamsulosin Hcl 0.4 mg capsule  Amlodipine Besylate 5 mg tablet  Benazepril Hcl  Diclofenac Sodium  Laxative  Lorazepam 0.5 mg tablet  Nasacort  Ondansetron Hcl 8 mg tablet  Oxycodone-Acetaminophen 5 mg-325 mg tablet  Pravastatin Sodium 40 mg tablet  Tolterodine Tartrate  Trazodone Hcl 100 mg tablet  Venlafaxine Hcl Er 75 mg capsule, ext release 24 hr     GU PSH: None     PSH Notes: colon surgery   Anal fissure     NON-GU PSH: Cholecystectomy (laparoscopic)     GU PMH: History of prostate cancer      PMH Notes:  1898-03-27 00:00:00 - Note: Normal Routine History And Physical Adult   NON-GU PMH: Anxiety Asthma Colon Cancer, History Depression GERD Hypercholesterolemia Hypertension    FAMILY HISTORY: Alzheimer's Disease - Mother Kidney Failure - Father Kidney Stones - Runs in Family   SOCIAL HISTORY: Marital Status: Married Preferred Language: English; Race: White Current Smoking Status: Patient has never smoked.   Tobacco  Use Assessment Completed: Used Tobacco in last 30 days?     Notes: 1 son   REVIEW OF SYSTEMS:    GU Review Male:   Patient reports frequent urination, hard to postpone urination, burning/ pain with urination, get up at night to urinate, leakage of urine, stream starts and stops, trouble starting your stream, erection problems, and penile pain. Patient denies have to strain to urinate .  Gastrointestinal (Upper):   Patient denies nausea, vomiting, and indigestion/ heartburn.  Gastrointestinal (Lower):   Patient reports constipation. Patient denies diarrhea.  Constitutional:   Patient denies fever, night sweats, weight loss, and fatigue.  Skin:   Patient denies skin rash/ lesion and itching.  Eyes:   Patient denies blurred vision and double vision.  Ears/ Nose/ Throat:   Patient denies sore throat and sinus problems.  Hematologic/Lymphatic:   Patient denies swollen glands and easy bruising.  Cardiovascular:   Patient denies leg swelling and chest pains.  Respiratory:   Patient denies cough and shortness of breath.  Endocrine:   Patient denies excessive thirst.  Musculoskeletal:   Patient reports back pain. Patient denies joint pain.  Neurological:   Patient denies headaches and dizziness.  Psychologic:   Patient denies depression and anxiety.   VITAL SIGNS:      09/20/2018 01:37 PM  Weight 182 lb / 82.55 kg  Height 68 in / 172.72 cm  BP 97/60 mmHg  Heart Rate 91 /min  Temperature 98.4 F / 36.8 C  BMI 27.7 kg/m   MULTI-SYSTEM PHYSICAL EXAMINATION:  Constitutional: Well-nourished. No physical deformities. Normally developed. Good grooming.  Respiratory: No labored breathing, no use of accessory muscles.   Cardiovascular: Normal temperature, adequate perfusion of extremities  Skin: No paleness, no jaundice  Neurologic / Psychiatric: Oriented to time, oriented to place, oriented to person. No depression, no anxiety, no agitation.  Gastrointestinal: No mass, no tenderness, no rigidity,  non obese abdomen.  Eyes: Normal conjunctivae. Normal eyelids.  Musculoskeletal: Normal gait and station of head and neck.     PAST DATA REVIEWED:  Source Of History:  Patient  X-Ray Review: C.T. Abdomen/Pelvis: Reviewed Films. Discussed With Radiologist. Discussed With Patient.     PROCEDURES:         KUB - [id number]  A single view of the abdomen is obtained.  Right ureteral calculus readily identified.      Patient confirmed No Neulasta OnPro Device.            Urinalysis w/Scope Dipstick Dipstick Cont'd Micro  Color: Amber Bilirubin: 1+ mg/dL WBC/hpf: >60/hpf  Appearance: Cloudy Ketones: Neg mg/dL RBC/hpf: 40 - 60/hpf  Specific Gravity: 1.025 Blood: 3+ ery/uL Bacteria: Many (>50/hpf)  pH: 5.5 Protein: 2+ mg/dL Cystals: NS (Not Seen)  Glucose: Neg mg/dL Urobilinogen: 1.0 mg/dL Casts: Hyaline    Nitrites: Positive Trichomonas: Not Present    Leukocyte Esterase: 3+ leu/uL Mucous: Not Present      Epithelial Cells: 0 - 5/hpf      Yeast: NS (Not Seen)      Sperm: Not Present    ASSESSMENT:      ICD-10 Details  1 GU:   Ureteral calculus - W96.7   2   Renal colic - R91    PLAN:           Orders Labs Urine Culture  X-Rays: KUB          Schedule         Document Letter(s):  Created for Patient: Clinical Summary         Notes:   Scheduled for right ureteroscopy with laser lithotripsy and ureteral stent placement. Continue Bactrim and follow up on urine culture. He does not have symptoms of infection. Urine may have the appearance of infection due to his intermittent catheterization. Either way, follow up on urine culture. I think ureteroscopy is a better idea for him given the low but possible need for blood transfusion if he were to have a perinephric hematoma that was significant After an ESWL. He understands potential risks including but not limited to bleeding, infection, injury to surrounding structures, need for additional procedures.   Cc: Moshe Cipro    Signed by Link Snuffer, III, M.D. on 09/20/18 at 3:04 PM (EDT

## 2018-09-30 NOTE — Anesthesia Preprocedure Evaluation (Addendum)
Anesthesia Evaluation  Patient identified by MRN, date of birth, ID band Patient awake    Reviewed: Allergy & Precautions, NPO status , Patient's Chart, lab work & pertinent test results, reviewed documented beta blocker date and time   History of Anesthesia Complications Negative for: history of anesthetic complications  Airway Mallampati: II  TM Distance: >3 FB Neck ROM: Full    Dental  (+) Dental Advisory Given, Teeth Intact   Pulmonary neg pulmonary ROS,    breath sounds clear to auscultation       Cardiovascular hypertension, Pt. on home beta blockers and Pt. on medications (-) angina Rhythm:Regular Rate:Normal     Neuro/Psych negative neurological ROS  negative psych ROS   GI/Hepatic Neg liver ROS, GERD  Medicated and Controlled,  Endo/Other  negative endocrine ROS  Renal/GU Renal InsufficiencyRenal disease Renal stones     Urethral stricture     Musculoskeletal negative musculoskeletal ROS (+)   Abdominal   Peds  Hematology negative hematology ROS (+)   Anesthesia Other Findings   Reproductive/Obstetrics                            Anesthesia Physical Anesthesia Plan  ASA: III  Anesthesia Plan: General   Post-op Pain Management:    Induction: Intravenous  PONV Risk Score and Plan: 3 and Treatment may vary due to age or medical condition and Ondansetron  Airway Management Planned: LMA  Additional Equipment: None  Intra-op Plan:   Post-operative Plan: Extubation in OR  Informed Consent: I have reviewed the patients History and Physical, chart, labs and discussed the procedure including the risks, benefits and alternatives for the proposed anesthesia with the patient or authorized representative who has indicated his/her understanding and acceptance.     Dental advisory given  Plan Discussed with: CRNA and Anesthesiologist  Anesthesia Plan Comments:         Anesthesia Quick Evaluation

## 2018-10-02 NOTE — Anesthesia Postprocedure Evaluation (Signed)
Anesthesia Post Note  Patient: Romell Wolden  Procedure(s) Performed: CYSTOSCOPY WITH RIGHT URETEROSCOPY HOLMIUM LASER AND STENT PLACEMENT (Right Ureter) URETHRAL DILATATION (N/A Urethra)     Patient location during evaluation: PACU Anesthesia Type: General Level of consciousness: awake and alert Pain management: pain level controlled Vital Signs Assessment: post-procedure vital signs reviewed and stable Respiratory status: spontaneous breathing, nonlabored ventilation and respiratory function stable Cardiovascular status: blood pressure returned to baseline and stable Postop Assessment: no apparent nausea or vomiting Anesthetic complications: no    Last Vitals:  Vitals:   09/30/18 1800 09/30/18 1815  BP: (!) 147/73 139/85  Pulse: 78 76  Resp: 13 12  Temp:  36.8 C  SpO2: 96% 94%    Last Pain:  Vitals:   09/30/18 1815  TempSrc:   PainSc: 5                  Audry Pili

## 2018-10-03 ENCOUNTER — Encounter (HOSPITAL_COMMUNITY): Payer: Self-pay | Admitting: Urology

## 2018-11-30 ENCOUNTER — Other Ambulatory Visit: Payer: Self-pay

## 2018-11-30 ENCOUNTER — Emergency Department (HOSPITAL_COMMUNITY)
Admission: EM | Admit: 2018-11-30 | Discharge: 2018-11-30 | Disposition: A | Discharge status: Home / Self Care | Payer: Medicare Other | Attending: Emergency Medicine | Admitting: Emergency Medicine

## 2018-11-30 ENCOUNTER — Emergency Department (HOSPITAL_COMMUNITY): Payer: Medicare Other

## 2018-11-30 ENCOUNTER — Encounter (HOSPITAL_COMMUNITY): Payer: Self-pay | Admitting: Emergency Medicine

## 2018-11-30 DIAGNOSIS — Z79899 Other long term (current) drug therapy: Secondary | ICD-10-CM | POA: Insufficient documentation

## 2018-11-30 DIAGNOSIS — I10 Essential (primary) hypertension: Secondary | ICD-10-CM | POA: Diagnosis not present

## 2018-11-30 DIAGNOSIS — C787 Secondary malignant neoplasm of liver and intrahepatic bile duct: Secondary | ICD-10-CM | POA: Diagnosis not present

## 2018-11-30 DIAGNOSIS — R1031 Right lower quadrant pain: Secondary | ICD-10-CM | POA: Insufficient documentation

## 2018-11-30 DIAGNOSIS — C61 Malignant neoplasm of prostate: Secondary | ICD-10-CM | POA: Diagnosis not present

## 2018-11-30 DIAGNOSIS — R109 Unspecified abdominal pain: Secondary | ICD-10-CM

## 2018-11-30 DIAGNOSIS — N3091 Cystitis, unspecified with hematuria: Secondary | ICD-10-CM | POA: Insufficient documentation

## 2018-11-30 DIAGNOSIS — Z960 Presence of urogenital implants: Secondary | ICD-10-CM | POA: Insufficient documentation

## 2018-11-30 DIAGNOSIS — N390 Urinary tract infection, site not specified: Secondary | ICD-10-CM

## 2018-11-30 LAB — URINALYSIS, ROUTINE W REFLEX MICROSCOPIC
Bilirubin Urine: NEGATIVE
Glucose, UA: NEGATIVE mg/dL
Ketones, ur: NEGATIVE mg/dL
Nitrite: NEGATIVE
Protein, ur: 30 mg/dL — AB
RBC / HPF: >50 RBC/hpf — ABNORMAL HIGH (ref 0–5)
Specific Gravity, Urine: 1.015 (ref 1.005–1.030)
WBC, UA: >50 WBC/hpf — ABNORMAL HIGH (ref 0–5)
pH: 5 (ref 5.0–8.0)

## 2018-11-30 MED ORDER — ONDANSETRON HCL 4 MG/2ML IJ SOLN
4.0000 mg | Freq: Once | INTRAMUSCULAR | Status: AC
  Administered 2018-11-30: 4 mg via INTRAVENOUS
  Filled 2018-11-30: qty 2

## 2018-11-30 MED ORDER — CIPROFLOXACIN HCL 250 MG PO TABS: 250.0000 mg | ORAL_TABLET | Freq: Two times a day (BID) | ORAL | 0 refills | Status: AC

## 2018-11-30 MED ORDER — HYDROCODONE-ACETAMINOPHEN 5-325 MG PO TABS: 1.0000 | ORAL_TABLET | ORAL | 0 refills | Status: AC | PRN

## 2018-11-30 MED ORDER — FENTANYL CITRATE (PF) 100 MCG/2ML IJ SOLN
50.0000 ug | INTRAMUSCULAR | Status: DC | PRN
  Administered 2018-11-30: 12:00:00 50 ug via INTRAVENOUS
  Filled 2018-11-30: qty 2

## 2018-11-30 MED ORDER — HYDROCODONE-ACETAMINOPHEN 5-325 MG PO TABS: 2.0000 | ORAL_TABLET | ORAL | 0 refills | Status: DC | PRN

## 2018-11-30 NOTE — Discharge Instructions (Signed)
It appears that you do not currently have an obstructing kidney stone, contributing to your pain.  Your urinalysis is abnormal, and we are prescribing an antibiotic to treat for infection.  You may have passed a stone into your bladder, to explain the pain.  The CAT scan was abnormal, consistent with your prior history of liver metastases.  Continue following up with your doctor treating your metastatic prostate cancer, and make sure you call the urologist for a follow-up appointment regarding kidney stones and urine infection.

## 2018-11-30 NOTE — ED Triage Notes (Signed)
Patient c/o right flank pain that radiates into right lower abd that started this morning. Denies any nausea, vomiting, diarrhea, or fevers. Per patient hx of kidney stones in which this feels pain feels similar too. Patient reports some blood with self cath this morning. Patient self caths once a day due to damage to urethra from radiation. Patient has prostate cancer x8 years currently undergo hormone therapy for cancer.

## 2018-11-30 NOTE — ED Provider Notes (Signed)
[facility] EMERGENCY DEPARTMENT Provider Note   CSN: [id number] Arrival date & time: 11/30/18  1111     History   Chief Complaint Chief Complaint  Patient presents with   Flank Pain    HPI Donald Gibson is a 73 y.o. male.     HPI   He presents for evaluation of right flank pain rating to the right lower quadrant of his abdomen.  The pain is reminiscent of kidney stone.  He states he had starting kidney stone requiring "surgery," last month.  This morning when he did his usual daily self-catheterization he noted blood in the urine.  He denies fever, chills, dysuria, urinary frequency, change in bowel habits, focal weakness paresthesia.  He did feel unsteady when walking today, while coming to the emergency department.  There are no other known modifying factors.  Past Medical History:  Diagnosis Date   Cancer [facility])    prostate   History of kidney stones    Hypertension    Pneumonia 2019   Renal disorder    Urethral stricture in male     There are no active problems to display for this patient.   Past Surgical History:  Procedure Laterality Date   ANAL FISSURE REPAIR     CHOLECYSTECTOMY     COLON SURGERY     CYSTOSCOPY WITH URETEROSCOPY AND STENT PLACEMENT Right 09/30/2018   Procedure: CYSTOSCOPY WITH RIGHT URETEROSCOPY HOLMIUM LASER AND STENT PLACEMENT;  Surgeon: Lucas Mallow, MD;  Location: [facility] ORS;  Service: Urology;  Laterality: Right;   CYSTOSCOPY WITH URETHRAL DILATATION N/A 09/30/2018   Procedure: URETHRAL DILATATION;  Surgeon: Lucas Mallow, MD;  Location: [facility] ORS;  Service: Urology;  Laterality: N/A;   TONSILLECTOMY          Home Medications    Prior to Admission medications   Medication Sig Start Date End Date Taking? Authorizing Provider  amLODipine (NORVASC) 5 MG tablet Take 5 mg by mouth daily.    Yes [provider]  benazepril (LOTENSIN) 40 MG tablet Take 40 mg by mouth 2 (two) times daily.   Yes [provider]  bicalutamide (CASODEX) 50 MG tablet Take 50 mg by mouth daily.  11/21/18 11/21/19 Yes [provider]  bisacodyl (DULCOLAX) 5 MG EC tablet Take 5 mg by mouth daily as needed for moderate constipation.   Yes [provider]  diclofenac (VOLTAREN) 75 MG EC tablet Take 75 mg by mouth 2 (two) times daily.   Yes [provider]  LORazepam (ATIVAN) 0.5 MG tablet Take 0.5 mg by mouth 2 (two) times daily as needed for anxiety.   Yes [provider]  metoprolol tartrate (LOPRESSOR) 50 MG tablet Take 50 mg by mouth 2 (two) times daily with a meal.   Yes [provider]  omeprazole (PRILOSEC) 20 MG capsule Take 20 mg by mouth daily.   Yes [provider]  ondansetron (ZOFRAN) 4 MG tablet Take 4 mg by mouth every 8 (eight) hours as needed for nausea. for nausea 09/25/18  Yes [provider]  pravastatin (PRAVACHOL) 40 MG tablet Take 40 mg by mouth at bedtime.   Yes [provider]  tamsulosin (FLOMAX) 0.4 MG CAPS capsule Take 0.4 mg by mouth at bedtime.   Yes [provider]  tolterodine (DETROL LA) 4 MG 24 hr capsule Take 4 mg by mouth at bedtime.    Yes [provider]  traZODone (DESYREL) 100 MG tablet Take 100 mg by  mouth at bedtime.   Yes [provider]  triamcinolone (NASACORT ALLERGY 24HR) 55 MCG/ACT AERO nasal inhaler Place 2 sprays into the nose daily as needed (allergies).   Yes [provider]  venlafaxine XR (EFFEXOR-XR) 150 MG 24 hr capsule Take 150 mg by mouth daily with breakfast. Take with 75 mg to equal 225 mg daily   Yes [provider]  venlafaxine XR (EFFEXOR-XR) 75 MG 24 hr capsule Take 75 mg by mouth daily with breakfast. Take with 150 mg to equal 225 mg daily   Yes [provider]  ciprofloxacin (CIPRO) 250 MG tablet Take 1 tablet (250 mg total) by mouth every 12 (twelve) hours. 11/30/18   Daleen Bo, MD  HYDROcodone-acetaminophen (NORCO) 5-325 MG tablet Take 1  tablet by mouth every 4 (four) hours as needed for moderate pain. 11/30/18   Daleen Bo, MD    Family History Family History  Problem Relation Age of Onset   Alzheimer's disease Mother    Hypertension Father    Kidney failure Father     Social History Social History   Tobacco Use   Smoking status: Never Smoker   Smokeless tobacco: Never Used  Substance Use Topics   Alcohol use: Never    Frequency: Never   Drug use: Never     Allergies   Ivp dye [iodinated diagnostic agents], Codeine, and Penicillins   Review of Systems Review of Systems  All other systems reviewed and are negative.    Physical Exam Updated Vital Signs BP (!) 156/83 (BP Location: Right Arm)    Pulse 69    Temp 98.2 F (36.8 C) (Oral)    Resp 18    Ht 5\' 8"  (1.727 m)    Wt 78.9 kg    SpO2 99%    BMI 26.46 kg/m   Physical Exam Vitals signs and nursing note reviewed.  Constitutional:      Appearance: He is well-developed.  HENT:     Head: Normocephalic and atraumatic.     Right Ear: External ear normal.     Left Ear: External ear normal.  Eyes:     Conjunctiva/sclera: Conjunctivae normal.     Pupils: Pupils are equal, round, and reactive to light.  Neck:     Musculoskeletal: Normal range of motion and neck supple.     Trachea: Phonation normal.  Cardiovascular:     Rate and Rhythm: Normal rate and regular rhythm.     Heart sounds: Normal heart sounds.  Pulmonary:     Effort: Pulmonary effort is normal.     Breath sounds: Normal breath sounds.  Abdominal:     General: There is no distension.     Palpations: Abdomen is soft. There is no mass.     Tenderness: There is no abdominal tenderness.  Musculoskeletal: Normal range of motion.  Skin:    General: Skin is warm and dry.  Neurological:     Mental Status: He is alert and oriented to person, place, and time.     Cranial Nerves: No cranial nerve deficit.     Sensory: No sensory deficit.     Motor: No abnormal muscle tone.      Coordination: Coordination normal.  Psychiatric:        Mood and Affect: Mood normal.        Behavior: Behavior normal.        Thought Content: Thought content normal.        Judgment: Judgment normal.  ED Treatments / Results  Labs (all labs ordered are listed, but only abnormal results are displayed) Labs Reviewed  URINALYSIS, ROUTINE W REFLEX MICROSCOPIC - Abnormal; Notable for the following components:      Result Value   APPearance HAZY (*)    Hgb urine dipstick LARGE (*)    Protein, ur 30 (*)    Leukocytes,Ua SMALL (*)    RBC / HPF >50 (*)    WBC, UA >50 (*)    Bacteria, UA RARE (*)    All other components within normal limits  URINE CULTURE    EKG None  Radiology Ct Renal Stone Study  Result Date: 11/30/2018 CLINICAL DATA:  Patient c/o right flank pain that radiates into right lower abd that started this morning. Denies any nausea, vomiting, diarrhea, or fevers. Per patient hx of kidney stones EXAM: CT ABDOMEN AND PELVIS WITHOUT CONTRAST TECHNIQUE: Multidetector CT imaging of the abdomen and pelvis was performed following the standard protocol without IV contrast. COMPARISON:  CT renal stone protocol 09/19/2018 FINDINGS: Lower chest: Mild dependent atelectasis. Borderline enlarged heart. Assessment of the abdominal organs is limited by lack of IV contrast. Hepatobiliary: There are multiple hypodense masses throughout the liver for example in the peripheral right hepatic lobe measuring 21 mm (series 2, image 20) which appear increased in size compared to the prior CT from June 2020. These are incompletely characterized in the absence of IV contrast. Status post cholecystectomy. Pancreas: Unremarkable. No surrounding inflammatory changes. Spleen: Normal in size without focal abnormality. Adrenals/Urinary Tract: Adrenal glands are unremarkable. Bilateral renal cortical thinning. Redemonstrated 4 mm calculus in the inferior pole the right kidney. No hydronephrosis. Bladder  again demonstrates surrounding fat stranding with thick walled appearance particularly along the posterior wall. 5 mm stone is seen in the bladder which may represent the previously seen stone in the proximal right ureter. Stomach/Bowel: Stomach is within normal limits. No evidence of bowel wall thickening, distention, or inflammatory changes. Postsurgical changes seen along the sigmoid colon. Vascular/Lymphatic: Aortic atherosclerotic calcification without aneurysm. No enlarged abdominal or pelvic lymph nodes. Multiple prominent collateral vessels in the left upper quadrant are similar to the prior study. Reproductive: Heterogeneous prostate. Other: No abdominal wall hernia or abnormality. No abdominopelvic ascites. Musculoskeletal: Degenerative changes in the lower lumbar spine. No acute abnormality. IMPRESSION: 1. Redemonstrated nonobstructing 4 mm calculus in inferior pole the right kidney. There is a 5 mm stone in the bladder which may represent the stone previoulsy seen in the proximal right ureter. 2. Persistent asymmetric bladder wall thickening with surrounding fat stranding, may represent cystitis, correlate with urinalysis. However focality of the wall thickening is somewhat suspicious. Consider direct visualization. 3. Interval increase in size of multiple hypodense masses throughout the liver, incompletely characterized in the absence of IV contrast. These are concerning given patient's history of malignancy. Further evaluation with MRI is recommended. Aortic Atherosclerosis (ICD10-I70.0). These results were called by telephone at the time of interpretation on 11/30/2018 at 1:08 pm to Dr. Daleen Bo , who verbally acknowledged these results. Electronically Signed   By: Audie Pinto M.D.   On: 11/30/2018 13:08    Procedures Procedures (including critical care time)  Medications Ordered in ED Medications  fentaNYL (SUBLIMAZE) injection 50 mcg (50 mcg Intravenous Given 11/30/18 1227)    ondansetron (ZOFRAN) injection 4 mg (4 mg Intravenous Given 11/30/18 1227)     Initial Impression / Assessment and Plan / ED Course  I have reviewed the triage vital signs and the nursing notes.  Pertinent labs & imaging results that were available during my care of the patient were reviewed by me and considered in my medical decision making (see chart for details).  Clinical Course as of Nov 30 1346  Sat Nov 30, 2018  1314 Abnormal, presence of hemoglobin, protein, leukocytes, RBC, WBCs, bacteria  Urinalysis, Routine w reflex microscopic- may I&O cath if menses(!) [EW]  1315 I discussed the case findings of the abdominal CT, with the on-call radiologist, relative to findings and recommend follow-up.   [EW]  [id number] No hydronephrosis or hydroureter.  Right renal stone and likely urinary bladder stone.  Incidental finding for possible hepatic metastases, abnormal urinary bladder wall and abnormal prostate.  CT Renal Laren Everts [EW]    Clinical Course User Index [EW] Daleen Bo, MD        Patient Vitals for the past 24 hrs:  BP Temp Temp src Pulse Resp SpO2 Height Weight  11/30/18 1125 (!) 156/83 98.2 F (36.8 C) Oral 69 18 99 % 5\' 8"  (1.727 m) 78.9 kg    1:18 PM Reevaluation with update and discussion. After initial assessment and treatment, an updated evaluation reveals patient is comfortable, drinking water and tolerating it well.  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  He is currently being treated with hormonal therapy, by his physicians at [facility] for that.  I discussed the abnormal CT findings with him including liver metastases bladder wall thickening, and absence of obstructing kidney stones at this time.  All questions were answered. Daleen Bo   Medical Decision Making: Right flank pain, nonspecific, possibly related to UTI, passed kidney stone, or prostate metastases.  Patient proved after treatment is stable for discharge.   Urinalysis/urine culture ordered, will initiate oral antibiotic therapy.  Patient started follow-up with urology, and his oncologist, Annamary Rummage management.  Doubt sepsis, metabolic instability or impending vascular collapse.  CRITICAL CARE-no Performed by: Daleen Bo  Nursing Notes Reviewed/ Care Coordinated Applicable Imaging Reviewed Interpretation of Laboratory Data incorporated into ED treatment  The patient appears reasonably screened and/or stabilized for discharge and I doubt any other medical condition or other [facility] requiring further screening, evaluation, or treatment in the ED at this time prior to discharge.  Plan: Home Medications-continue usual medications; Home Treatments-gradually advance diet and activity; return here if the recommended treatment, does not improve the symptoms; Recommended follow up-PCP, PRN.  Urology, within 1 week.  Oncology as currently scheduled    Final Clinical Impressions(s) / ED Diagnoses   Final diagnoses:  Right flank pain  Urinary tract infection with hematuria, site unspecified  Liver metastases [facility])    ED Discharge Orders         Ordered    ciprofloxacin (CIPRO) 250 MG tablet  Every 12 hours     11/30/18 1346    HYDROcodone-acetaminophen (NORCO) 5-325 MG tablet  Every 4 hours PRN,   Status:  Discontinued     11/30/18 1346    HYDROcodone-acetaminophen (NORCO) 5-325 MG tablet  Every 4 hours PRN     11/30/18 1347           Daleen Bo, MD 11/30/18 1349

## 2018-12-03 LAB — URINE CULTURE: Culture: >=100000 — AB

## 2018-12-04 ENCOUNTER — Telehealth: Payer: Self-pay | Admitting: Emergency Medicine

## 2018-12-04 NOTE — Telephone Encounter (Signed)
Post ED Visit - Positive Culture Follow-up  Culture report reviewed by antimicrobial stewardship pharmacist: [place] Team []  Elenor Quinones, Pharm.D. []  Heide Guile, Pharm.D., BCPS AQ-ID []  Parks Neptune, Pharm.D., BCPS []  Alycia Rossetti, Pharm.D., BCPS []  [place], [place].D., BCPS, AAHIVP []  Legrand Como, Pharm.D., BCPS, AAHIVP []  Salome Arnt, PharmD, BCPS []  Johnnette Gourd, PharmD, BCPS []  Hughes Better, PharmD, BCPS []  Leeroy Cha, PharmD []  Laqueta Linden, PharmD, BCPS []  Albertina Parr, PharmD Agnes Lawrence PharmD  [place] Team []  Leodis Sias, PharmD []  Lindell Spar, PharmD []  Royetta Asal, PharmD []  Graylin Shiver, Rph []  Rema Fendt) Glennon Mac, PharmD []  Arlyn Dunning, PharmD []  Netta Cedars, PharmD []  Dia Sitter, PharmD []  Leone Haven, PharmD []  Gretta Arab, PharmD []  Theodis Shove, PharmD []  Peggyann Juba, PharmD []  Reuel Boom, PharmD   Positive urine culture Treated with ciprofloxacin, organism sensitive to the same and no further patient follow-up is required at this time.  Hazle Nordmann 12/04/2018, 1:30 PM

## 2019-03-28 DEATH — deceased

## 2021-07-27 IMAGING — CT CT RENAL STONE PROTOCOL
2 of 4 series · 15 of 46 positions shown, 17 images · non-contrast
Comparison: CT renal stone protocol 09/19/2018

CLINICAL DATA: Patient c/o right flank pain that radiates into
right lower abd that started this morning. Denies any nausea,
vomiting, diarrhea, or fevers. Per patient hx of kidney stones

EXAM:
CT ABDOMEN AND PELVIS WITHOUT CONTRAST
TECHNIQUE: Multidetector CT imaging of the abdomen and pelvis was performed
following the standard protocol without IV contrast.

[Series 2: axial st · axial · 0.75mm/px · z∈[+878,+1333]mm · 12 of 103 slices shown, 14 images]
[im 6/103  soft-tissue]
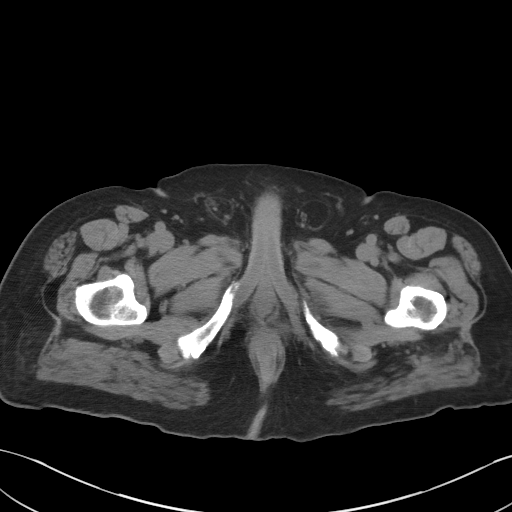
[im 6/103  bone]
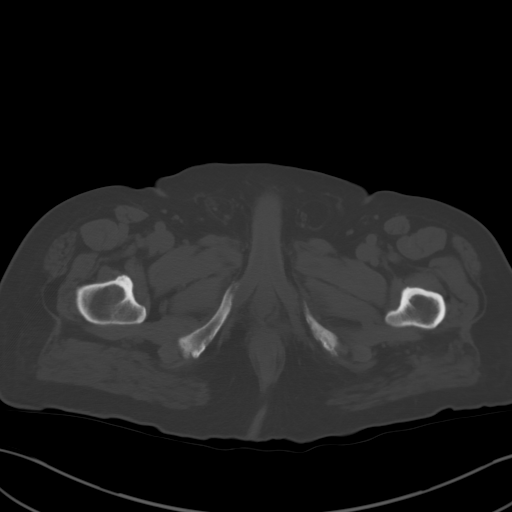
[im 17/103  soft-tissue]
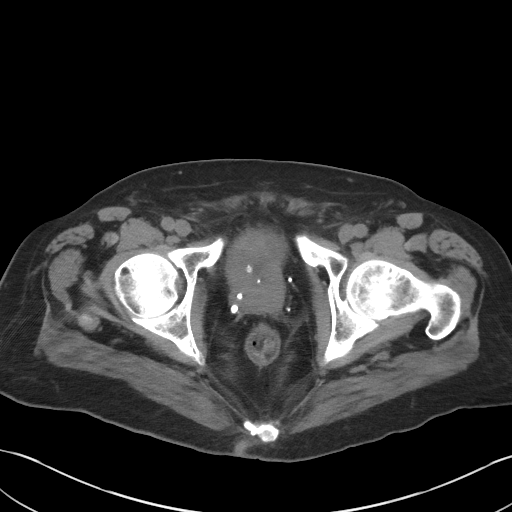
[im 22/103  soft-tissue]
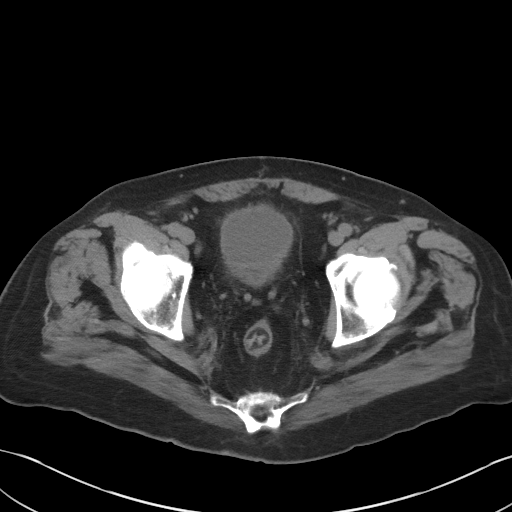
[im 33/103  soft-tissue]
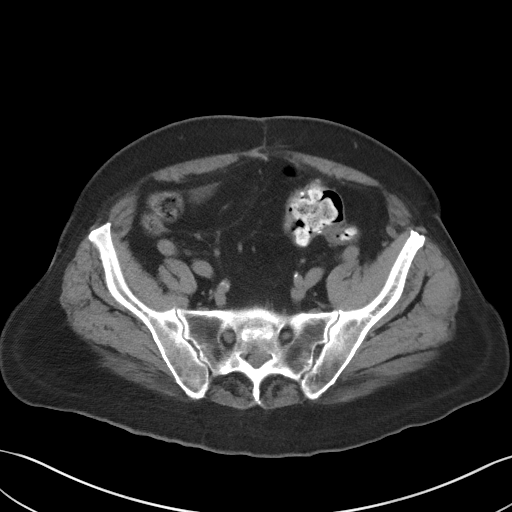
[im 38/103  soft-tissue]
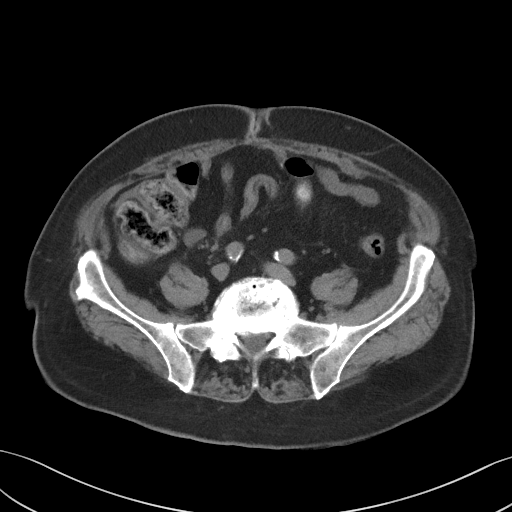
[im 49/103  soft-tissue]
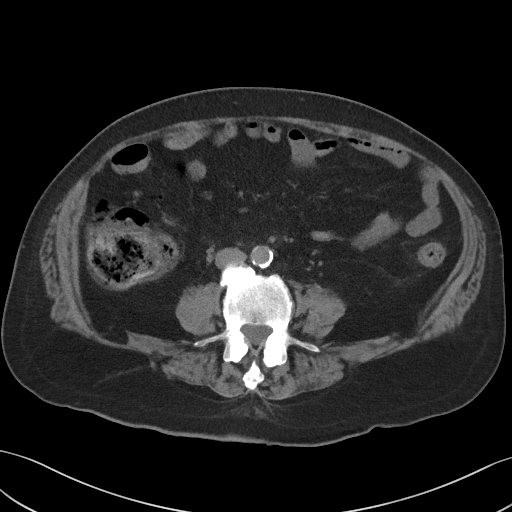
[im 54/103  soft-tissue]
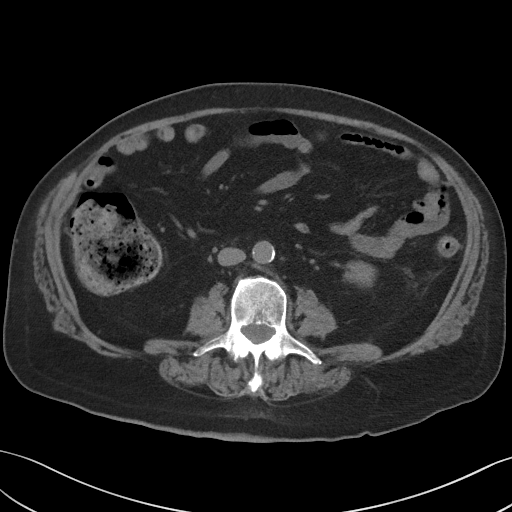
[im 65/103  soft-tissue]
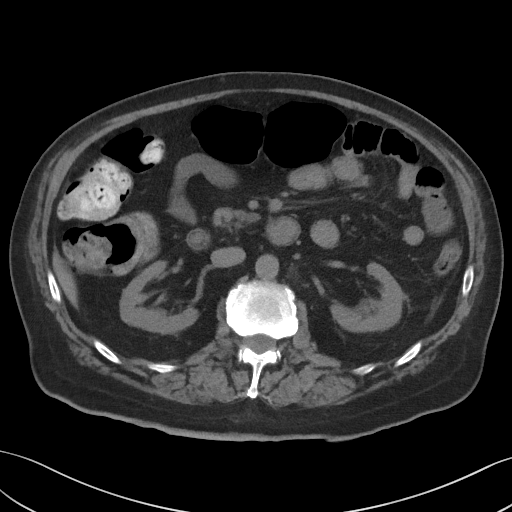
[im 70/103  soft-tissue]
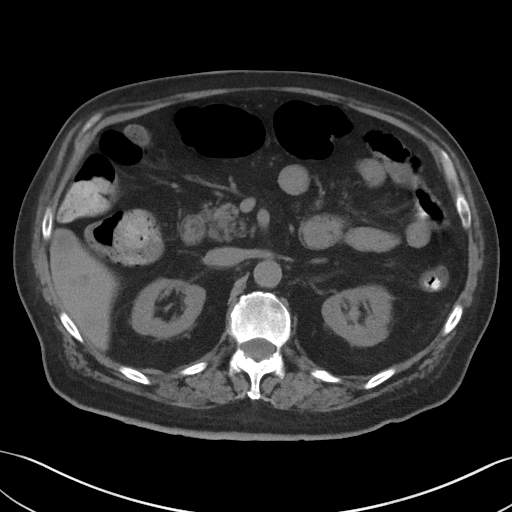
[im 70/103  bone]
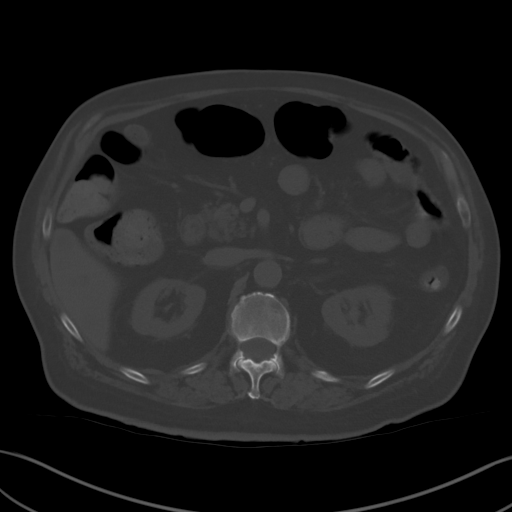
[im 81/103  soft-tissue]
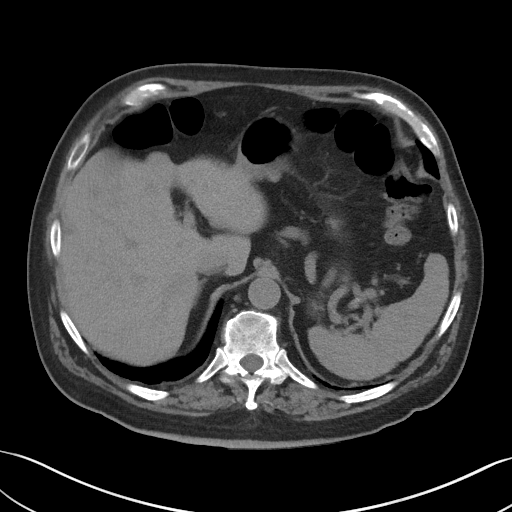
[im 86/103  soft-tissue]
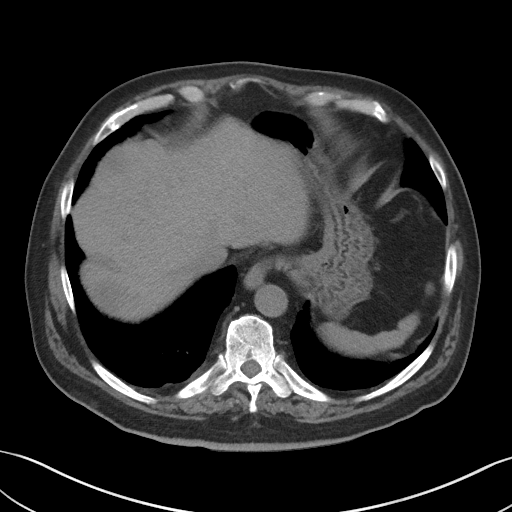
[im 97/103  soft-tissue]
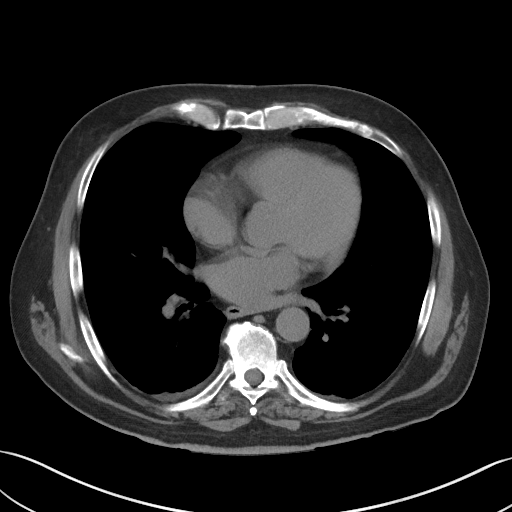

[Series 5: coronal st · coronal · 0.86mm/px · 3 of 106 slices shown]
[im 36/106  soft-tissue]
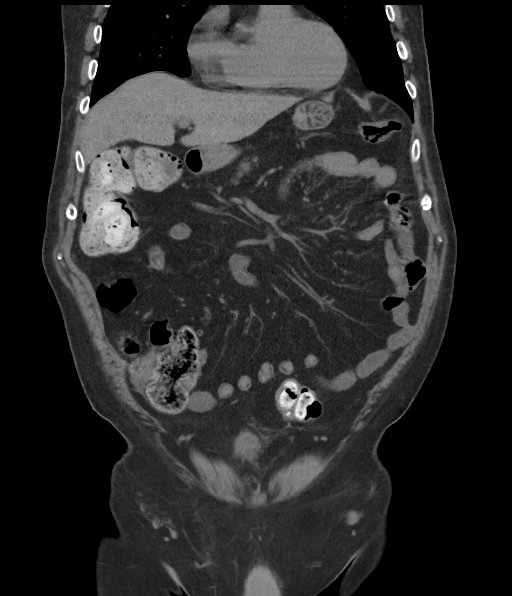
[im 47/106  soft-tissue]
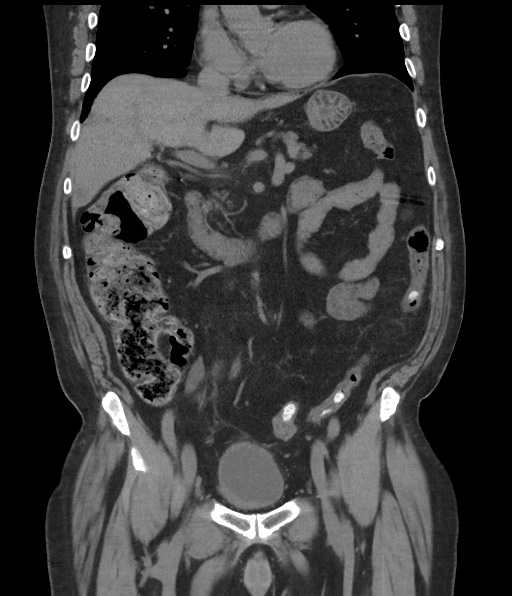
[im 59/106  soft-tissue]
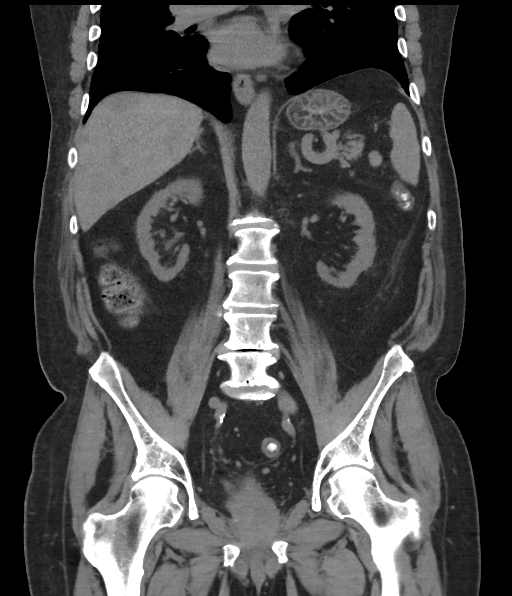

[15 of 46 positions shown; findings below may reference images not displayed]

FINDINGS: Lower chest: Mild dependent atelectasis. Borderline enlarged heart.

Assessment of the abdominal organs is limited by lack of IV
contrast.

Hepatobiliary: There are multiple hypodense masses throughout the
liver for example in the peripheral right hepatic lobe measuring 21
mm (series 2, image 20) which appear increased in size compared to
the prior CT from August 2018. These are incompletely characterized in
the absence of IV contrast. Status post cholecystectomy.

Pancreas: Unremarkable. No surrounding inflammatory changes.

Spleen: Normal in size without focal abnormality.

Adrenals/Urinary Tract: Adrenal glands are unremarkable. Bilateral
renal cortical thinning. Redemonstrated 4 mm calculus in the
inferior pole the right kidney. No hydronephrosis. Bladder again
demonstrates surrounding fat stranding with thick walled appearance
particularly along the posterior wall. 5 mm stone is seen in the
bladder which may represent the previously seen stone in the
proximal right ureter.

Stomach/Bowel: Stomach is within normal limits. No evidence of bowel
wall thickening, distention, or inflammatory changes. Postsurgical
changes seen along the sigmoid colon.

Vascular/Lymphatic: Aortic atherosclerotic calcification without
aneurysm. No enlarged abdominal or pelvic lymph nodes. Multiple
prominent collateral vessels in the left upper quadrant are similar
to the prior study.

Reproductive: Heterogeneous prostate.

Other: No abdominal wall hernia or abnormality. No abdominopelvic
ascites.

Musculoskeletal: Degenerative changes in the lower lumbar spine. No
acute abnormality.
IMPRESSION: 1. Redemonstrated nonobstructing 4 mm calculus in inferior pole the
right kidney. There is a 5 mm stone in the bladder which may
represent the stone previoulsy seen in the proximal right ureter.

2. Persistent asymmetric bladder wall thickening with surrounding
fat stranding, may represent cystitis, correlate with urinalysis.
However focality of the wall thickening is somewhat suspicious.
Consider direct visualization.

3. Interval increase in size of multiple hypodense masses throughout
the liver, incompletely characterized in the absence of IV contrast.
These are concerning given patient's history of malignancy. Further
evaluation with MRI is recommended.

Aortic Atherosclerosis ([id number]).

These results were called by telephone at the time of interpretation
on 11/30/2018 at [DATE] to Dr. FERIENHAUS ERXLEBEN , who verbally
acknowledged these results.
